# Patient Record
Sex: Male | Born: 1950 | Race: White | Hispanic: No | Smoking: Former smoker
Health system: Southern US, Community
[De-identification: ages and names within clinical notes are randomized; demographics above are authoritative.]

## PROBLEM LIST (undated history)

## (undated) DIAGNOSIS — I10 Essential (primary) hypertension: Secondary | ICD-10-CM

## (undated) DIAGNOSIS — E785 Hyperlipidemia, unspecified: Secondary | ICD-10-CM

## (undated) DIAGNOSIS — I2510 Atherosclerotic heart disease of native coronary artery without angina pectoris: Secondary | ICD-10-CM

## (undated) HISTORY — DX: Atherosclerotic heart disease of native coronary artery without angina pectoris: I25.10

## (undated) HISTORY — DX: Hyperlipidemia, unspecified: E78.5

## (undated) HISTORY — DX: Essential (primary) hypertension: I10

---

## 2005-06-06 ENCOUNTER — Emergency Department: Payer: Self-pay | Admitting: Emergency Medicine

## 2006-05-18 ENCOUNTER — Observation Stay: Payer: Self-pay | Admitting: Internal Medicine

## 2006-05-19 ENCOUNTER — Inpatient Hospital Stay (HOSPITAL_COMMUNITY): Admission: AD | Admit: 2006-05-19 | Discharge: 2006-05-21 | Payer: Self-pay | Admitting: Cardiology

## 2006-05-25 ENCOUNTER — Inpatient Hospital Stay (HOSPITAL_COMMUNITY): Admission: AD | Admit: 2006-05-25 | Discharge: 2006-05-27 | Payer: Self-pay | Admitting: Cardiology

## 2006-05-28 ENCOUNTER — Inpatient Hospital Stay (HOSPITAL_COMMUNITY): Admission: EM | Admit: 2006-05-28 | Discharge: 2006-05-30 | Payer: Self-pay | Admitting: Emergency Medicine

## 2006-10-01 ENCOUNTER — Emergency Department: Payer: Self-pay | Admitting: Emergency Medicine

## 2006-10-01 ENCOUNTER — Other Ambulatory Visit: Payer: Self-pay

## 2007-01-03 ENCOUNTER — Emergency Department: Payer: Self-pay | Admitting: General Practice

## 2007-08-14 IMAGING — CR DG CHEST 1V PORT
1 series · 1 of 1 positions shown · non-contrast
Comparison: none

REASON FOR EXAM: Chest tightness rm 4
COMMENTS:

[view not recorded]
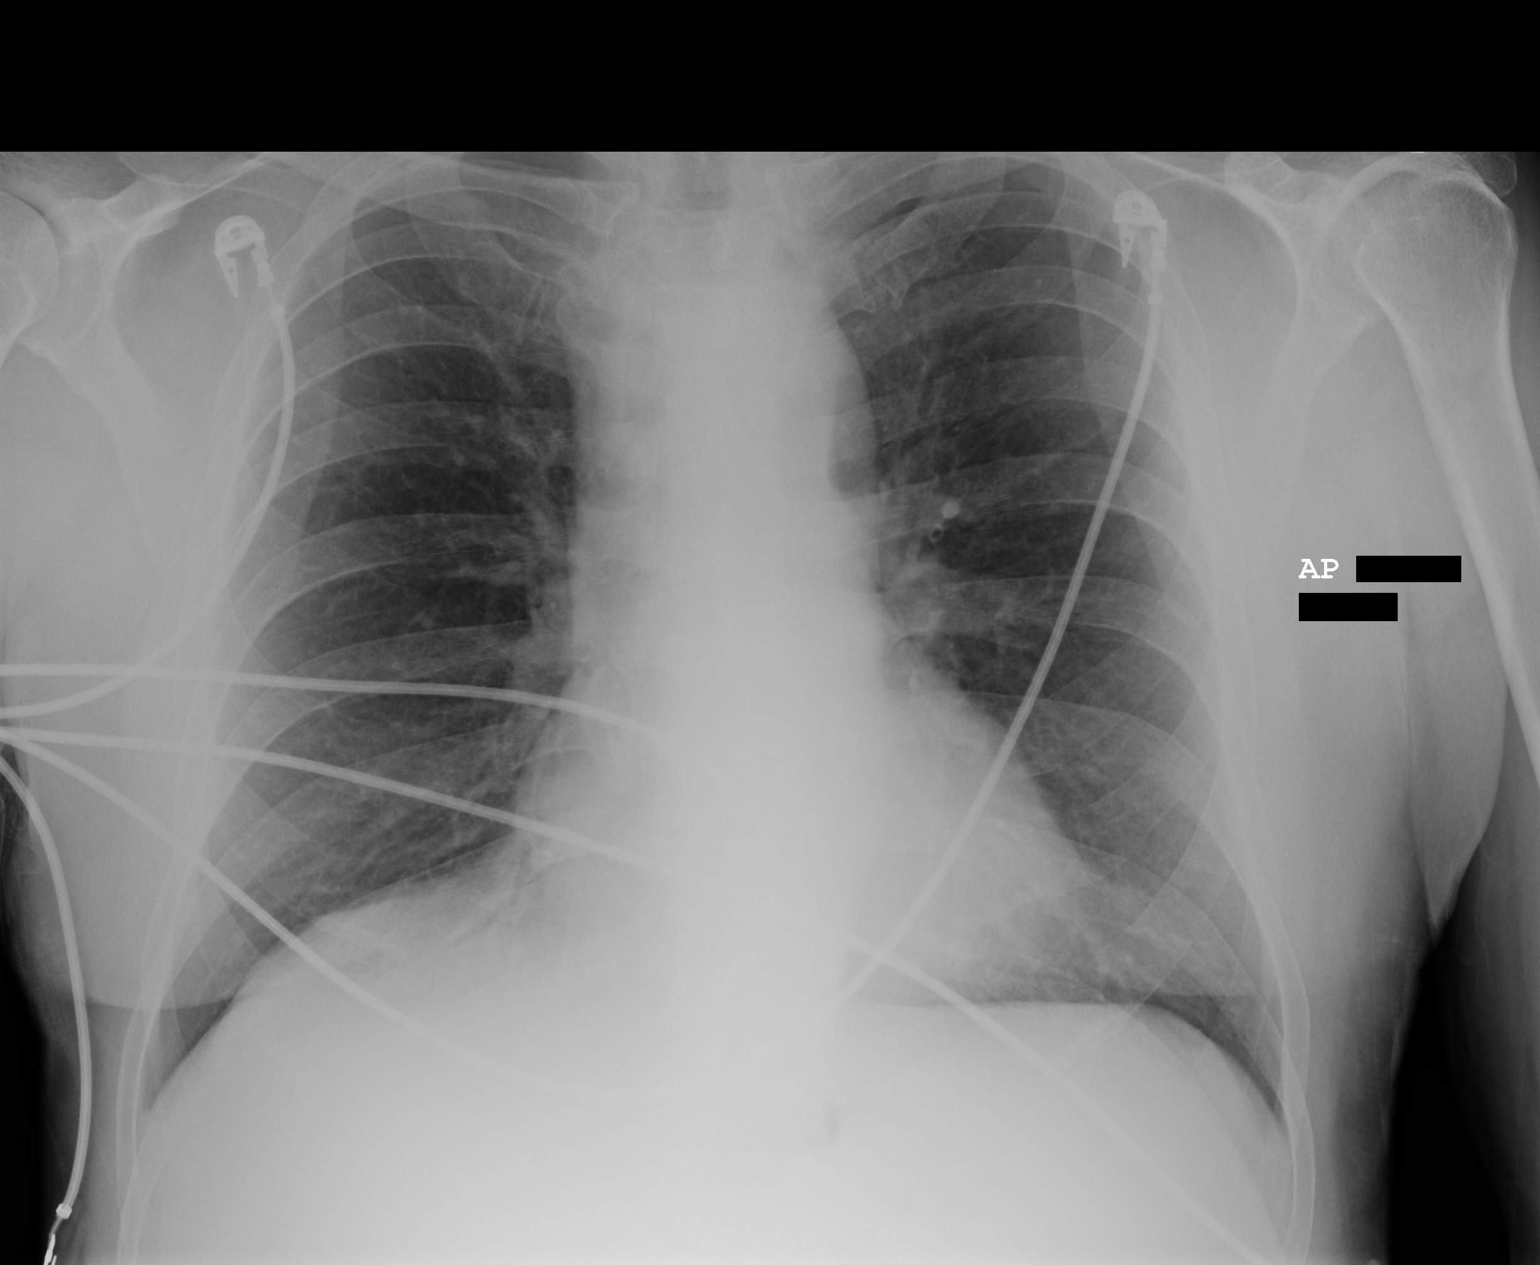

[1 of 1 positions shown; findings below may reference images not displayed]

PROCEDURE:     DXR - DXR PORTABLE CHEST SINGLE VIEW  - May 18, 2006  [DATE]

RESULT:     There is an element of flattening of the hemidiaphragms.  No
focal regions of consolidation are appreciated.  Cardiac silhouette is
within normal limits.  The visualized bony skeleton demonstrates no evidence
of fracture or dislocation.
IMPRESSION: Findings consistent with COPD without evidence of acute cardiopulmonary
disease.

## 2007-12-28 IMAGING — CR DG CHEST 2V
1 series · 2 of 2 positions shown · non-contrast
Comparison: none

REASON FOR EXAM: room--02  dizzness
COMMENTS:  LMP: (Male)

[Series 1: view not recorded · 0.17mm/px · 2 of 2 slices shown]
[im 1/2]
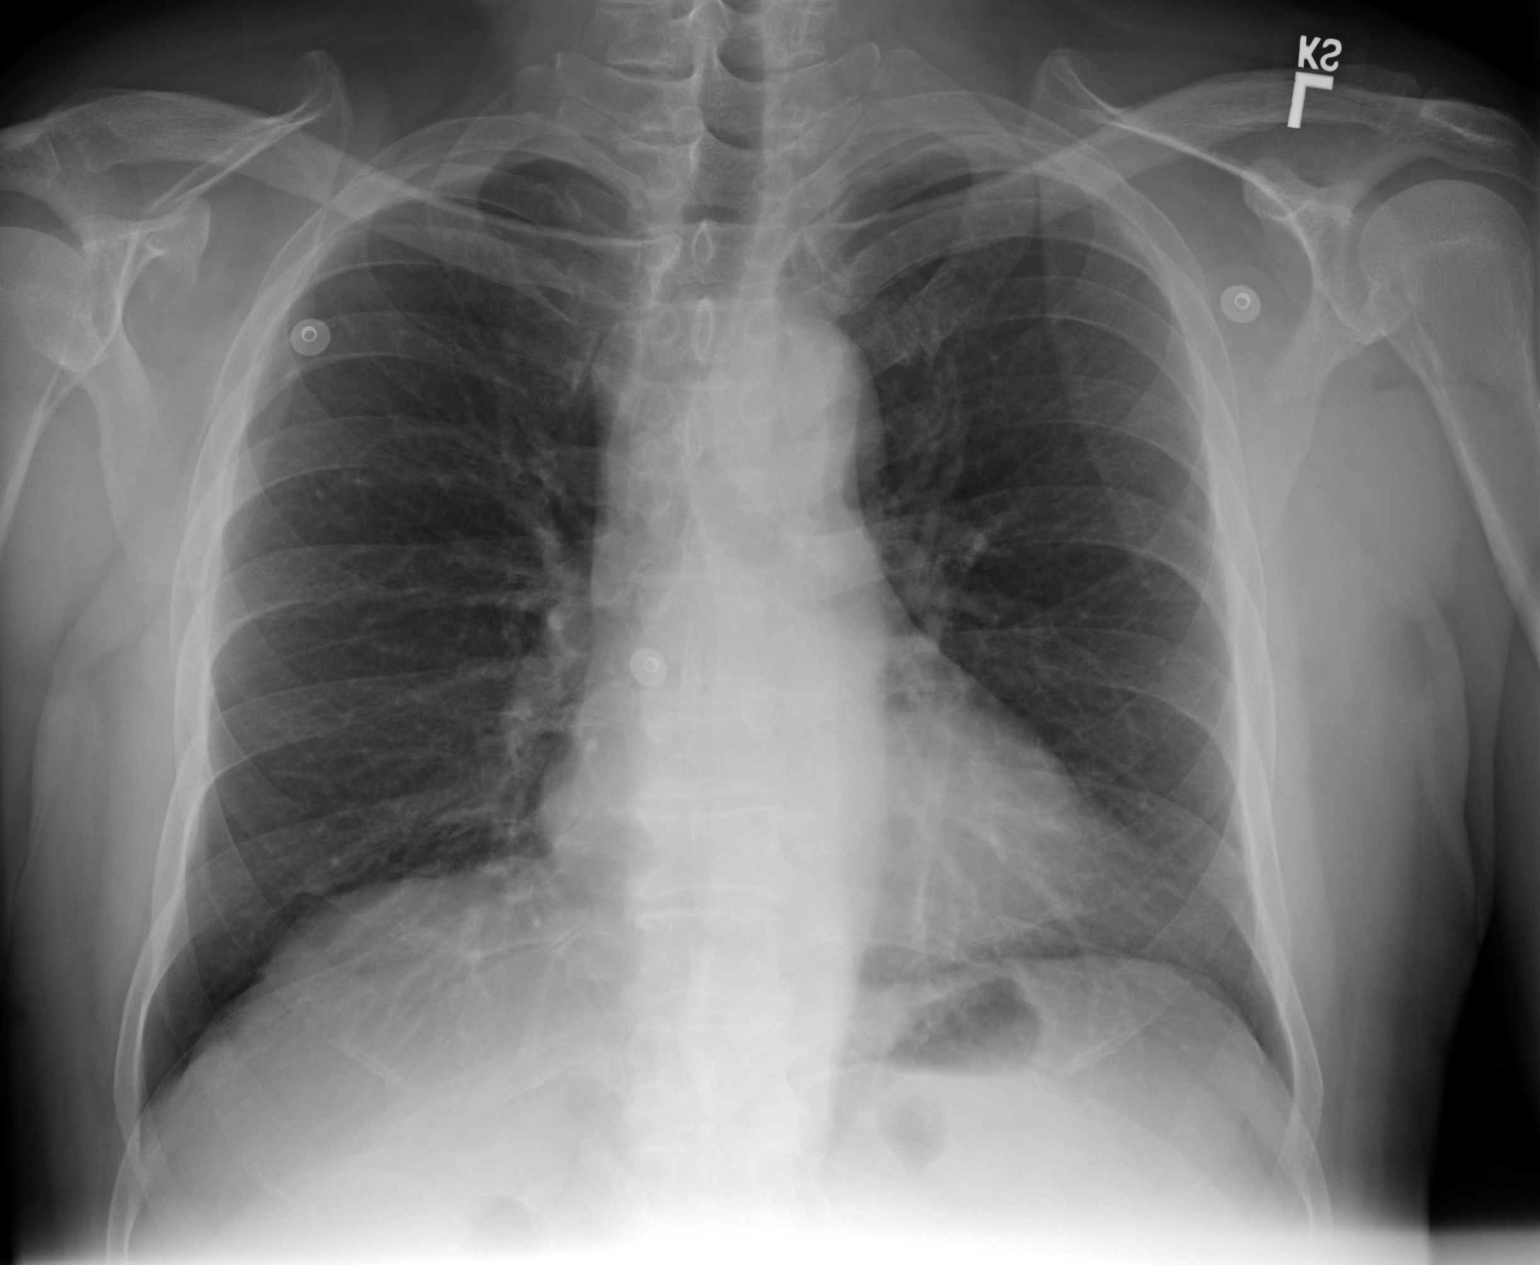
[im 2/2]
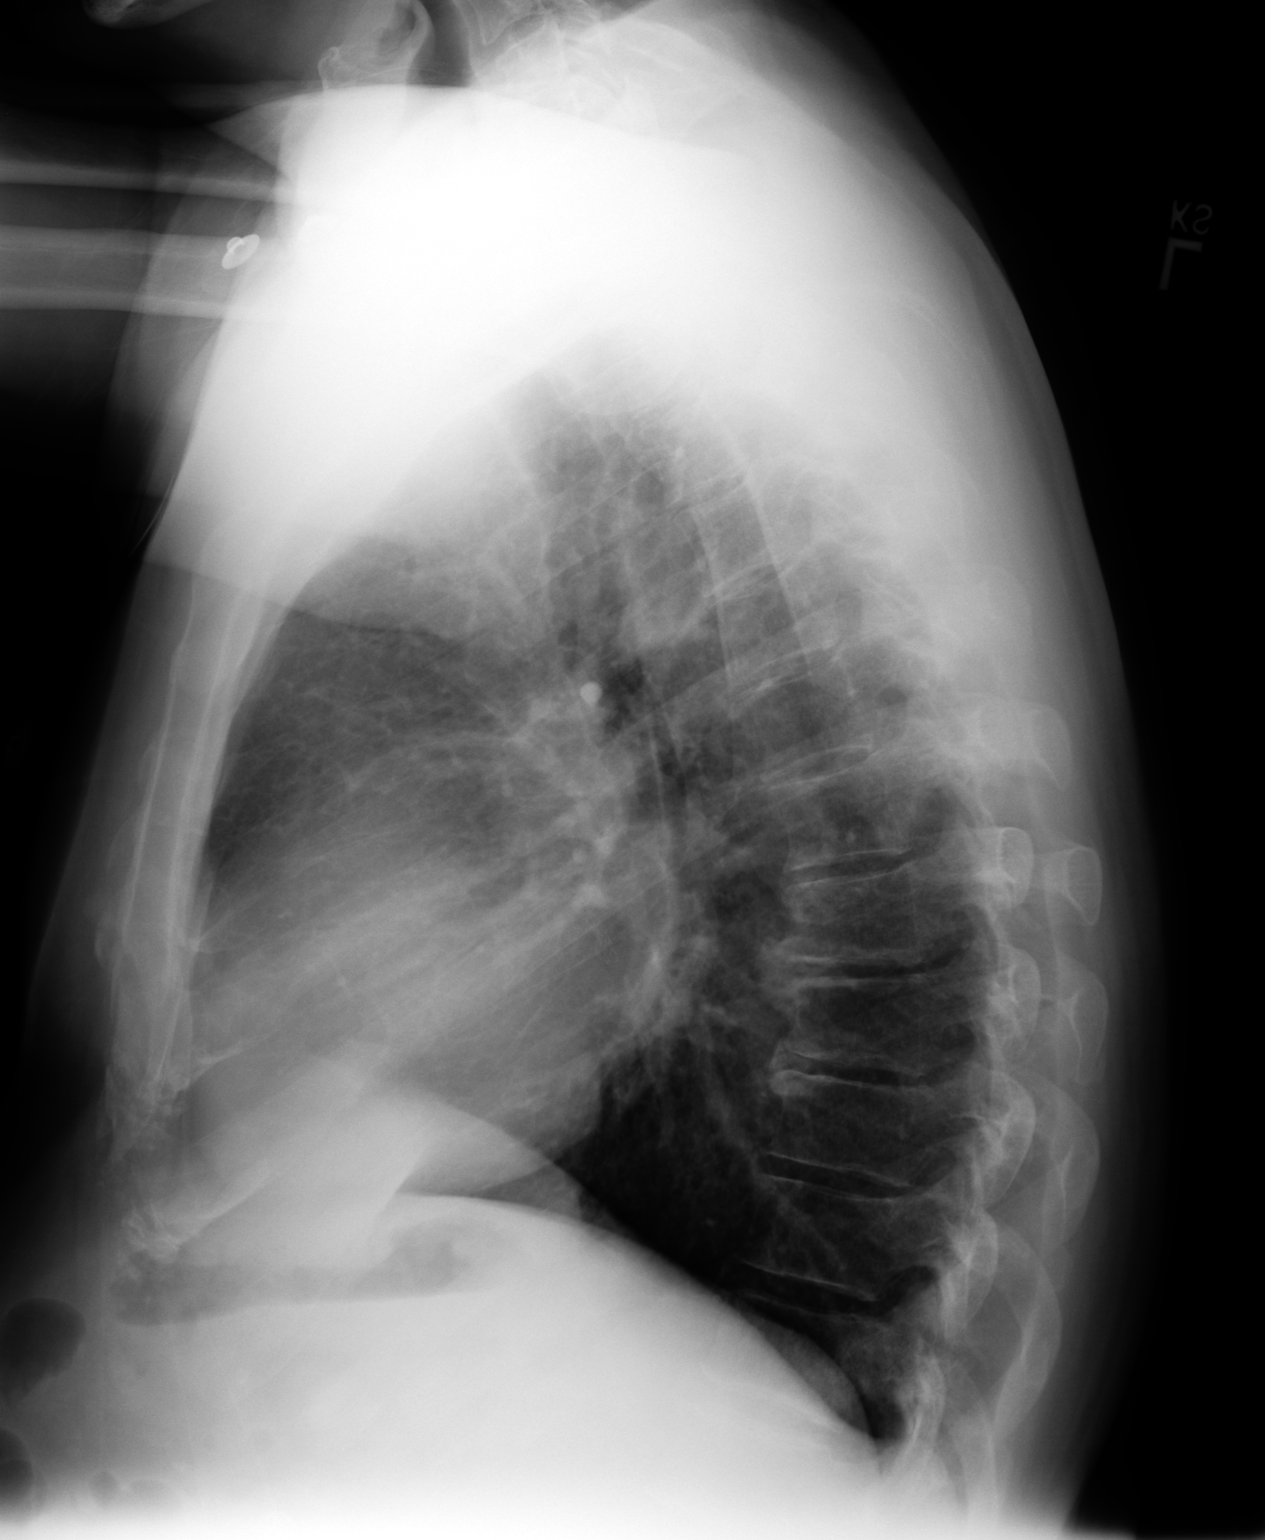

[2 of 2 positions shown; findings below may reference images not displayed]

PROCEDURE:     DXR - DXR CHEST PA (OR AP) AND LATERAL  - October 01, 2006  [DATE]

RESULT:     PA and lateral views were obtained and compared to prior AP
portable of 05/18/2006.  The cardiomediastinal structures are within normal
limits.  The lung fields are clear and the vascularity is within normal
limits.  NO effusions are seen.

Degenerative spurs are noted in the thoracic spine.
IMPRESSION: Lung fields are clear.  Basically unchanged from prior study.

## 2007-12-28 IMAGING — CT CT HEAD WITHOUT CONTRAST
2 series · 16 of 30 positions shown, 20 images · non-contrast
Comparison: none

REASON FOR EXAM: dizzness
COMMENTS:  LMP: (Male)

[Series 2: without · axial · non-contrast · 0.45mm/px · z∈[-127,+3]mm · 13 of 32 slices shown, 17 images]
[im 3/32  brain]
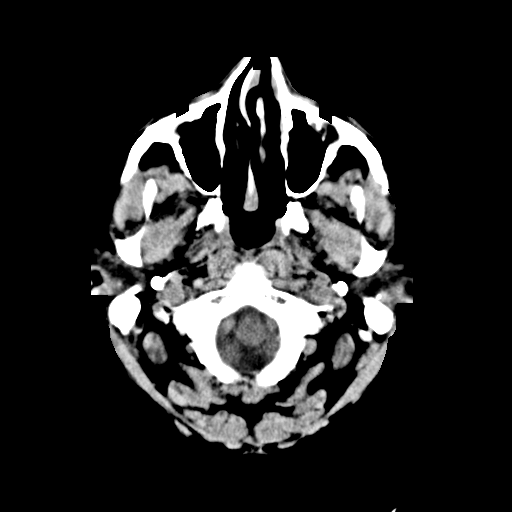
[im 3/32  bone]
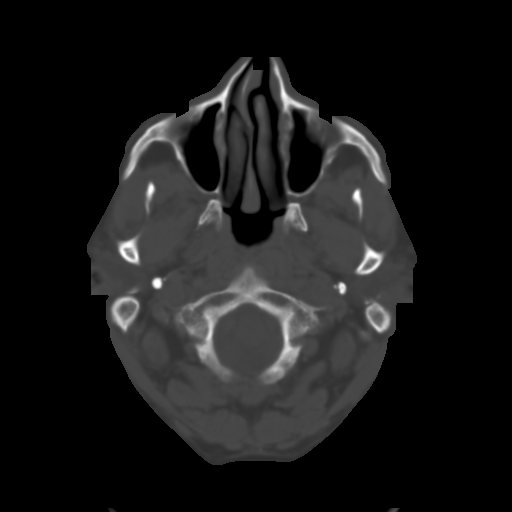
[im 5/32  brain]
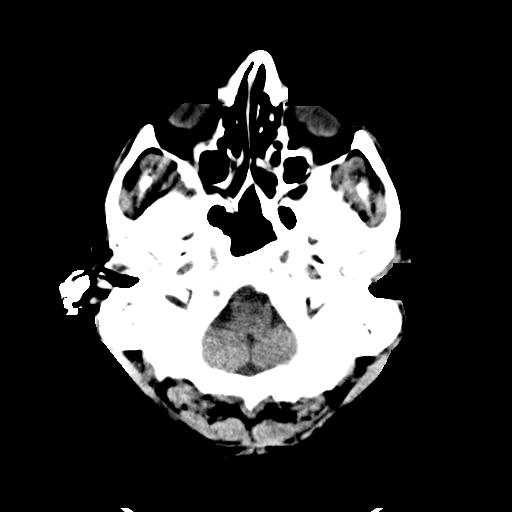
[im 7/32  brain]
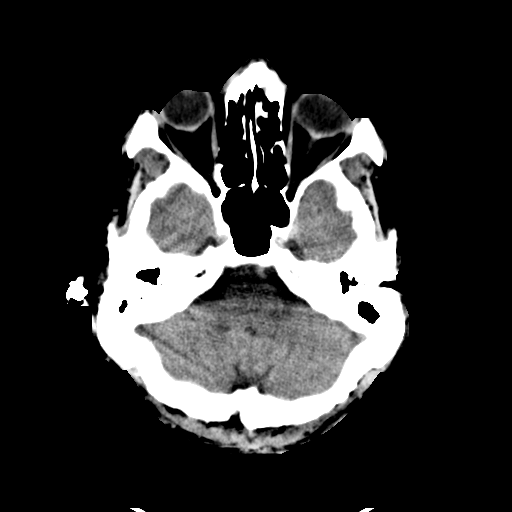
[im 9/32  brain]
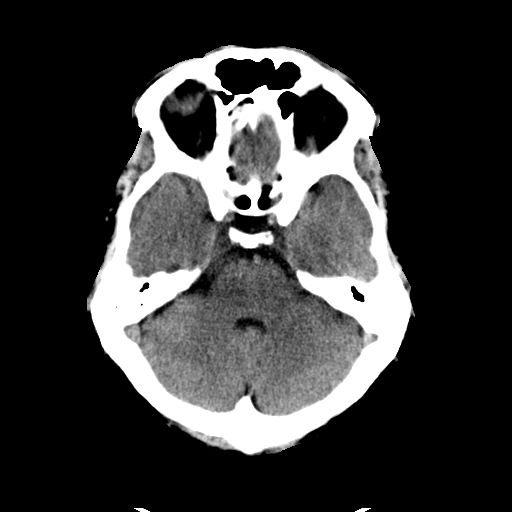
[im 12/32  brain]
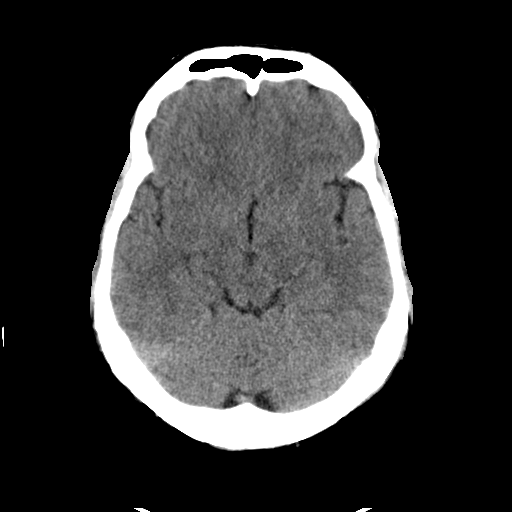
[im 12/32  bone]
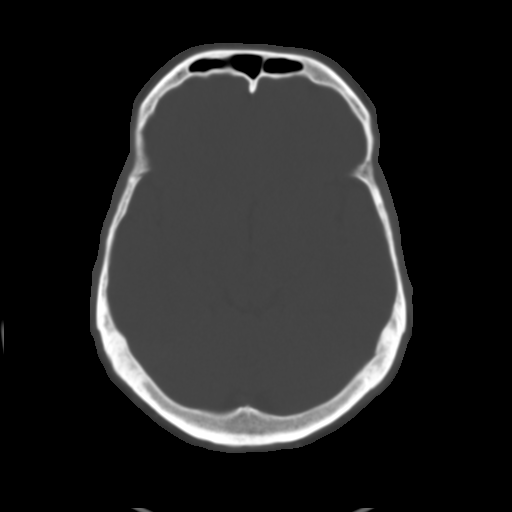
[im 14/32  brain]
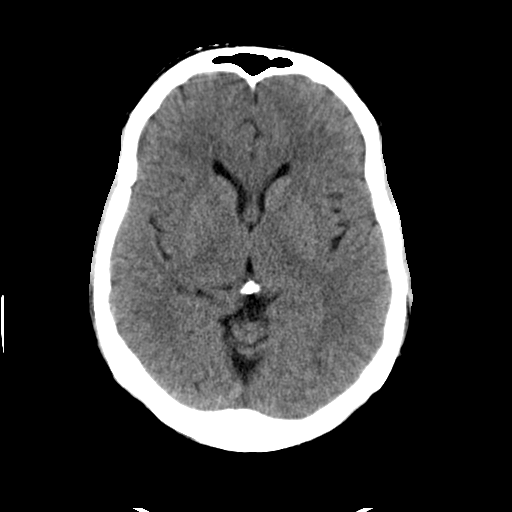
[im 16/32  brain]
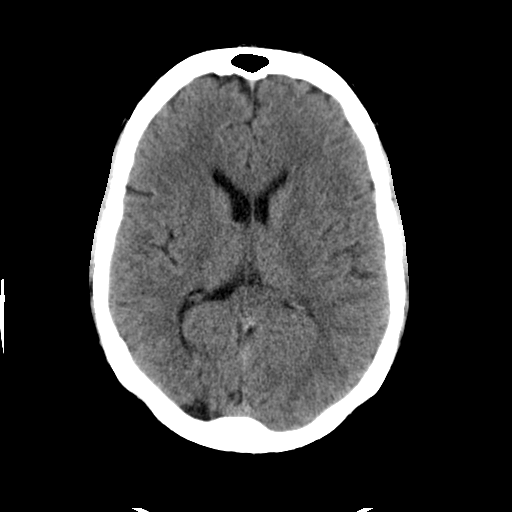
[im 18/32  brain]
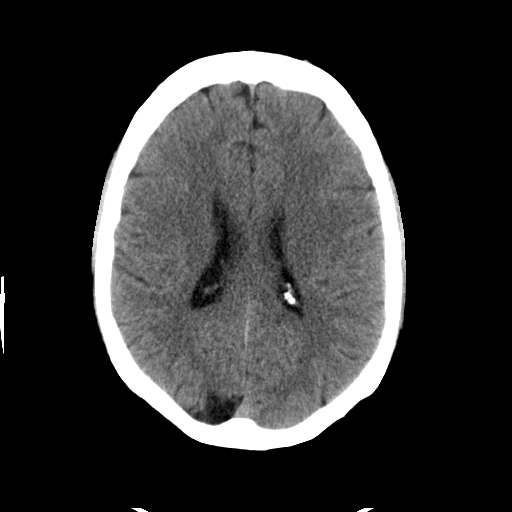
[im 20/32  brain]
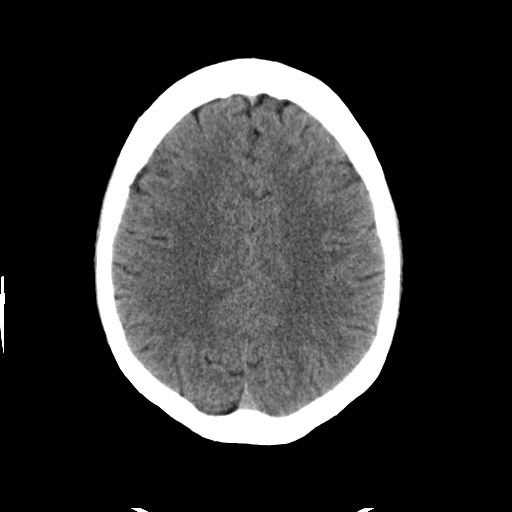
[im 20/32  bone]
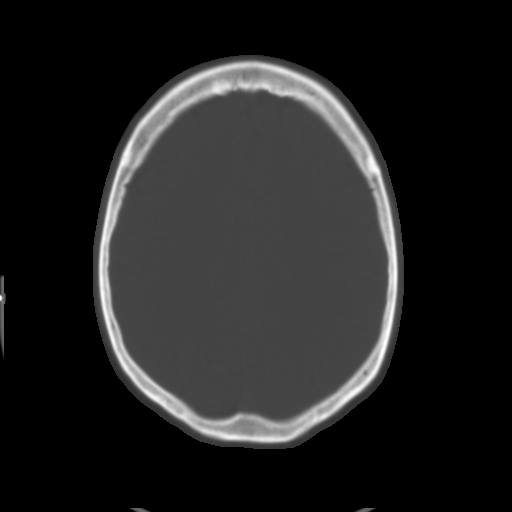
[im 23/32  brain]
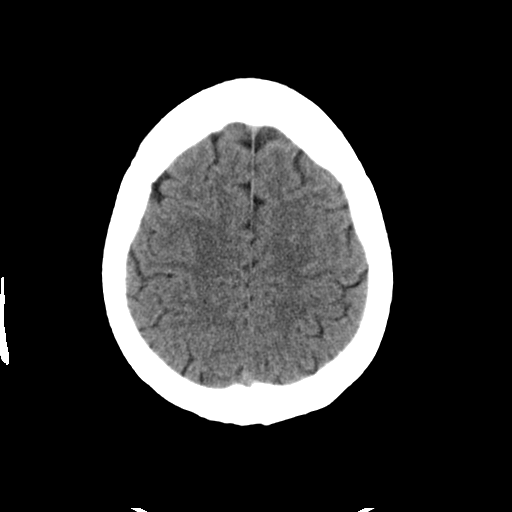
[im 25/32  brain]
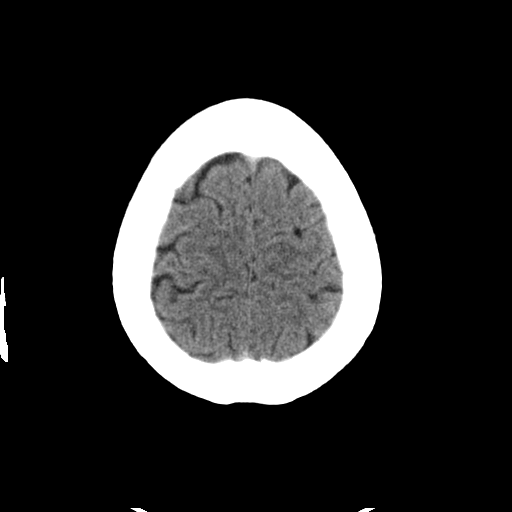
[im 27/32  brain]
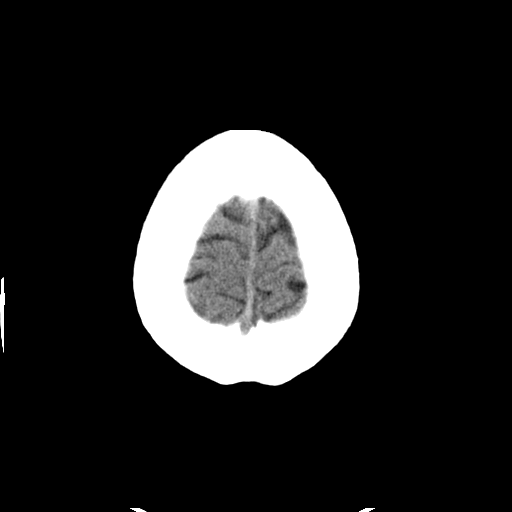
[im 29/32  brain]
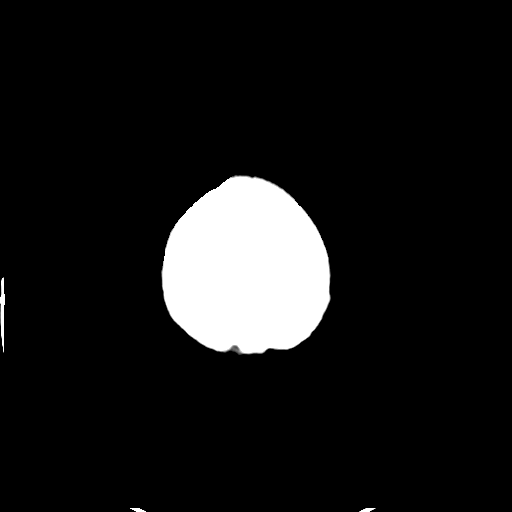
[im 29/32  bone]
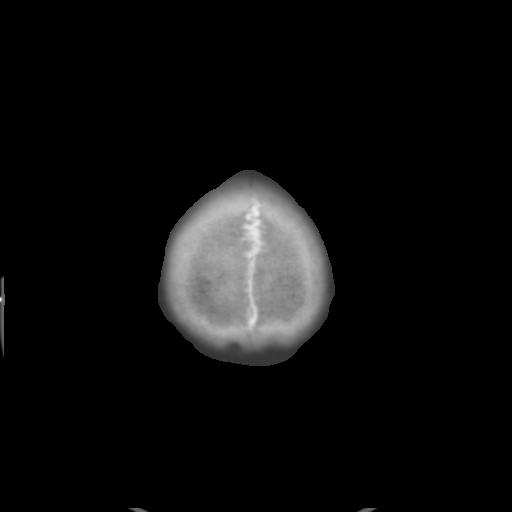

[Series 3: bone · axial · 0.45mm/px · z∈[-127,-82]mm · 3 of 32 slices shown]
[im 3/32  bone]
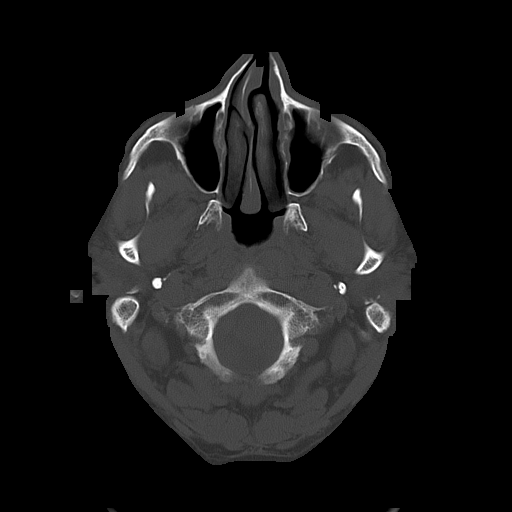
[im 7/32  bone]
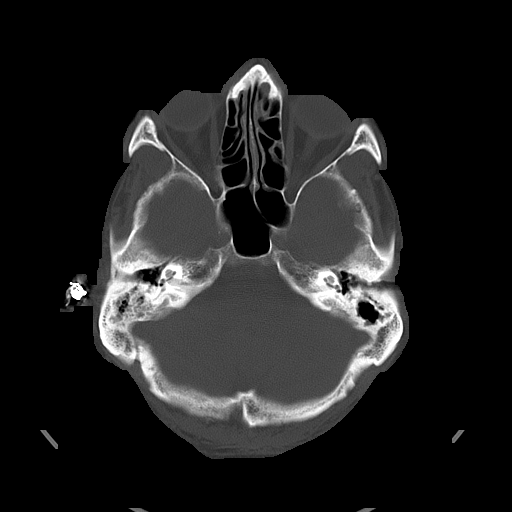
[im 12/32  bone]
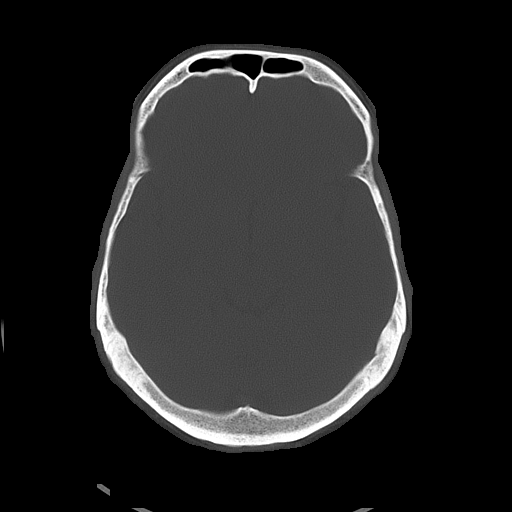

[16 of 30 positions shown; findings below may reference images not displayed]

PROCEDURE:     CT  - CT HEAD WITHOUT CONTRAST  - October 01, 2006  [DATE]

RESULT:     Unenhanced emergent head CT was performed for dizziness.  No
intracerebral bleeds.  No infarcts.  No mass effect.  No shift of midline.
No extraaxial fluid collections are identified.  Ventricles appear within
normal limits.

On the bone window settings no significant osseous abnormalities are
detected.
IMPRESSION: No acute abnormalities seen on the unenhanced head CT.  The report was
called to the emergency room at the conclusion of the dictation.

## 2018-03-15 ENCOUNTER — Ambulatory Visit: Payer: Self-pay | Admitting: Cardiovascular Disease

## 2023-03-18 ENCOUNTER — Encounter: Payer: Self-pay | Admitting: Cardiovascular Disease

## 2023-03-18 ENCOUNTER — Ambulatory Visit: Payer: Medicare HMO | Admitting: Cardiovascular Disease

## 2023-03-18 VITALS — BP 120/80 | HR 81 | Ht 67.0 in | Wt 170.0 lb

## 2023-03-18 DIAGNOSIS — E782 Mixed hyperlipidemia: Secondary | ICD-10-CM

## 2023-03-18 DIAGNOSIS — R0789 Other chest pain: Secondary | ICD-10-CM | POA: Diagnosis not present

## 2023-03-18 DIAGNOSIS — I2510 Atherosclerotic heart disease of native coronary artery without angina pectoris: Secondary | ICD-10-CM | POA: Diagnosis not present

## 2023-03-18 DIAGNOSIS — I10 Essential (primary) hypertension: Secondary | ICD-10-CM | POA: Diagnosis not present

## 2023-03-18 MED ORDER — NITROGLYCERIN 0.4 MG SL SUBL: 0.4000 mg | SUBLINGUAL_TABLET | SUBLINGUAL | 5 refills | Status: AC | PRN

## 2023-03-18 NOTE — Assessment & Plan Note (Signed)
Patient denies chest pain, shortness of breath. CCTA 12/2019 Stent in OM is patent. No significant disease in RCA or LAD. Normal stress test 11/2022. NTG refilled, current Rx expired. Continue same medications.

## 2023-03-18 NOTE — Progress Notes (Signed)
Cardiology Office Note   Date:  03/18/2023   ID:  Richard Porter, DOB Jun 06, 1951, MRN [id number]  PCP:  Laurier Nancy, MD  Cardiologist:  Adrian Blackwater, MD      History of Present Illness: Richard Porter is a 72 y.o. male who presents for  Chief Complaint  Patient presents with   Follow-up    4 Months Follow Up    Patient in office for routine cardiac exam. Denies chest pain, shortness of breath, edema, palpitations.     Past Medical History:  Diagnosis Date   CAD (coronary artery disease)    Hyperlipidemia    Hypertension      History reviewed. No pertinent surgical history.   Current Outpatient Medications  Medication Sig Dispense Refill   aspirin EC 81 MG tablet Take 81 mg by mouth once.     clopidogrel (PLAVIX) 75 MG tablet Take 1 tablet by mouth daily.     isosorbide mononitrate (IMDUR) 120 MG 24 hr tablet Take 120 mg by mouth daily.     lisinopril (ZESTRIL) 20 MG tablet Take 20 mg by mouth daily.     metoprolol succinate (TOPROL-XL) 25 MG 24 hr tablet Take 12.5 mg by mouth daily.     pantoprazole (PROTONIX) 20 MG tablet Take 20 mg by mouth daily.     Ped Multivitamins-Fl-Iron (MULTIVITAMIN/FLUORIDE/IRON PO) Take by mouth.     rosuvastatin (CRESTOR) 20 MG tablet Take 20 mg by mouth daily.     Turmeric 500 MG CAPS Take by mouth.     nitroGLYCERIN (NITROSTAT) 0.4 MG SL tablet Place 1 tablet (0.4 mg total) under the tongue every 5 (five) minutes as needed for chest pain. 20 tablet 5   No current facility-administered medications for this visit.    Allergies:   Patient has no allergy information on record.    Social History:   reports that he has quit smoking. His smoking use included cigarettes. He has never used smokeless tobacco. No history on file for alcohol use and drug use.   Family History:  family history is not on file.    ROS:     Review of Systems  Constitutional: Negative.   HENT: Negative.    Eyes: Negative.   Respiratory: Negative.     Cardiovascular: Negative.   Gastrointestinal: Negative.   Genitourinary: Negative.   Musculoskeletal: Negative.   Skin: Negative.   Neurological: Negative.   Endo/Heme/Allergies: Negative.   Psychiatric/Behavioral: Negative.    All other systems reviewed and are negative.    All other systems are reviewed and negative.    PHYSICAL EXAM: VS:  BP 120/80   Pulse 81   Ht 5\' 7"  (1.702 m)   Wt 170 lb (77.1 kg)   SpO2 96%   BMI 26.63 kg/m  , BMI Body mass index is 26.63 kg/m. Last weight:  Wt Readings from Last 3 Encounters:  03/18/23 170 lb (77.1 kg)     Physical Exam Vitals reviewed.  Constitutional:      Appearance: Normal appearance. He is normal weight.  HENT:     Head: Normocephalic.     Nose: Nose normal.     Mouth/Throat:     Mouth: Mucous membranes are moist.  Eyes:     Pupils: Pupils are equal, round, and reactive to light.  Cardiovascular:     Rate and Rhythm: Normal rate and regular rhythm.     Pulses: Normal pulses.     Heart sounds: Normal heart sounds.  Pulmonary:     Effort: Pulmonary effort is normal.  Abdominal:     General: Abdomen is flat. Bowel sounds are normal.  Musculoskeletal:        General: Normal range of motion.     Cervical back: Normal range of motion.  Skin:    General: Skin is warm.  Neurological:     General: No focal deficit present.     Mental Status: He is alert.  Psychiatric:        Mood and Affect: Mood normal.     EKG: none today  Recent Labs: No results found for requested labs within last 365 days.    Lipid Panel No results found for: "CHOL", "TRIG", "HDL", "CHOLHDL", "VLDL", "LDLCALC", "LDLDIRECT"    Other studies Reviewed: Patient: 8163.0 - Pola Corn DOB:  1951-08-10  Date:  10/29/2022 11:27 Provider: Adrian Blackwater MD Encounter: [facility]                                                                                        [facility] [address] [place], [place] [address] [phone] STUDY:  Gated Stress / Rest Myocardial Perfusion Imaging Tomographic (SPECT) Including attenuation correction Wall Motion, Left Ventricular Ejection Fraction By Gated Technique.Treadmill Stress Test. SEX: Male  WEIGHT: 176 lbs   HEIGHT: 70 in    ARMS UP: YES/NO                                                                        REFERRING PHYSICIAN: Dr.Ange Puskas [place] Flakes                                                                                                                                                                                                                       INDICATION FOR STUDY:  SOB  TECHNIQUE:  Approximately 20 minutes following the intravenous administration of 10.6 mCi of Tc-59m Sestamibi after stress testing in a reclined supine position with arms above their head if able to do so, gated SPECT imaging of the heart was performed. After about a 2hr break, the patient was injected intravenously with 31.9 mCi of Tc-89m Sestamibi.  Approximately 45 minutes later in the same position as stress imaging SPECT rest imaging of the heart was performed.  STRESS BY:  Adrian Blackwater, MD PROTOCOL: Smitty Cords                                                                                        MAX PRED HR: 148                     85%: 126               75%:  111                                                                                                                  RESTING BP: 140/78  RESTING HR: 64  PEAK BP: 152/88   PEAK HR: 111 (75%)                                                                     EXERCISE DURATION: 6:00                                             METS: 7.0     REASON FOR TEST TERMINATION: Fatigue  SYMPTOMS: Fatigue [facility] TREADMILL SCORE: 6                                       RISK: Low                                                                                                                                                                                                           EKG RESULTS: NSR. 63/min. No significant ST changes at peak exercise.                                                              IMAGE QUALITY: Good  PERFUSION/WALL MOTION FINDINGS: EF = 67%. No perfusion defects, normal wall motion.                                                                           IMPRESSION: Normal stress test with normal LVEF.                                                                                                                                                                                                                                                                                         Adrian Blackwater, MD Stress Interpreting Physician / Nuclear Interpreting Physician  Adrian Blackwater MD  Electronically signed by: Adrian Blackwater     Date: 11/02/2022 12:34  Patient: 8163.0 - Pola Corn DOB:  [birth year]  Date:  11/05/2022 11:30 Provider: Adrian Blackwater MD Encounter: ECHO   Page 2 REASON FOR VISIT  Visit for: Echocardiogram/R06.02  Sex:   Male         wt= 176   lbs.  BP=122/80  Height= 70   inches.    TESTS  Imaging: Echocardiogram:  An echocardiogram in (2-d) mode was performed and in Doppler mode with color flow velocity mapping was performed. The  aortic valve cusps are abnormal 2.3  cm, flow velocity .979  m/s, and systolic calculated mean flow gradient 2 mmHg. Mitral valve diastolic peak flow velocity E .711   m/s and E/A ratio 0.7. Aortic root diameter 3.7  cm. The LVOT internal diameter 3.4  cm and flow velocity was abnormal 1.02   m/s. LV systolic dimension 2.33  cm, diastolic 3.45  cm, posterior wall thickness 1.73   cm, fractional shortening 32.5  %, and EF 61.9 %. IVS thickness 1.65  cm. LA dimension 3.9 cm. Mitral Valve has Trace Regurgitation. Tricuspid Valve has Trace Regurgitation.     ASSESSMENT  Technically difficult study due to  body habitus.  Normal chamber sizes.  Normal left ventricular systolic function.  Mild left ventricular hypertrophy with GRADE 1 (relaxation abnormality) diastolic dysfunction.  Normal right ventricular systolic function.  Normal right ventricular diastolic function.  Normal left ventricular wall motion.  Normal right ventricular wall motion.  Trace tricuspid regurgitation.  Normal pulmonary artery pressure.  Trace mitral regurgitation.  No pericardial effusion.  Mildly dilated Left atrium.  Mild LVH.  Sonographer: Adrian Blackwater.   Adrian Blackwater MD  Electronically signed by: Adrian Blackwater     Date: 11/05/2022 13:00  Patient: 8163.0 - Pola Corn DOB:  1951-03-13  Date:  01/28/2020 09:30 Provider: Adrian Blackwater MD Encounter: ALL ANGIOGRAMS (CTA BRAIN, CAROTIDS, RENAL ARTERIES, PE)  TESTS  Imaging: Computed Tomographic Angiography:  Cardiac multidetector CT was performed paying particular attention to the coronary arteries for the diagnosis of: CAD. Diagnostic Drugs:  Administered iohexol (Omnipaque) through an antecubital vein and images from the examination were analyzed for the presence and extent of coronary artery disease, using 3D image processing software. 100 mL of non-ionic contrast (Omnipaque) was used.   TEST CONCLUSIONS  Quality of study: Good  1-Calcium score:  446.5  2-Right dominant system.  3-Stent in OM is patent. No significant disease in RCA or LAD.  Adrian Blackwater MD  Electronically signed by: Adrian Blackwater     Date: 01/29/2020 09:58   ASSESSMENT AND PLAN:    ICD-10-CM   1. Other chest pain  R07.89 nitroGLYCERIN (NITROSTAT) 0.4 MG SL tablet    2. Coronary artery disease involving native coronary artery of native heart without angina pectoris  I25.10     3. Mixed hyperlipidemia  E78.2     4. Primary hypertension  I10        Problem List Items Addressed This Visit       Cardiovascular and Mediastinum   Coronary artery disease involving native coronary artery of native heart without angina pectoris    Patient denies chest pain, shortness of breath. CCTA 12/2019 Stent in OM is patent. No significant disease in RCA or LAD. Normal stress test 11/2022. NTG refilled, current Rx expired. Continue same medications.       Relevant Medications   aspirin EC 81 MG tablet   metoprolol succinate (TOPROL-XL) 25 MG 24 hr tablet   lisinopril (ZESTRIL) 20 MG tablet   isosorbide mononitrate (IMDUR) 120 MG 24 hr tablet   rosuvastatin (CRESTOR) 20 MG tablet   nitroGLYCERIN (NITROSTAT) 0.4 MG SL tablet   Primary hypertension    Well controlled today. Echo 11/2022 EF 61%, grade I DD. Continue same medications.       Relevant Medications   aspirin EC 81 MG tablet   metoprolol succinate (TOPROL-XL) 25 MG 24 hr tablet   lisinopril (ZESTRIL) 20 MG tablet   isosorbide mononitrate (IMDUR) 120 MG 24 hr tablet   rosuvastatin (CRESTOR) 20 MG tablet   nitroGLYCERIN (NITROSTAT) 0.4 MG SL tablet     Other   Mixed hyperlipidemia    01/2023 LDL 39. Continue rosuvastatin 20 mg daily.      Relevant Medications   aspirin EC 81 MG tablet   metoprolol succinate (TOPROL-XL) 25 MG 24 hr tablet   lisinopril (ZESTRIL) 20 MG tablet   isosorbide mononitrate (IMDUR) 120 MG 24 hr tablet   rosuvastatin (CRESTOR) 20 MG tablet   nitroGLYCERIN (NITROSTAT) 0.4 MG SL  tablet   Other Visit Diagnoses     Other chest pain    -  Primary   Relevant  Medications   nitroGLYCERIN (NITROSTAT) 0.4 MG SL tablet       Disposition:   Return in about 4 months (around 07/19/2023).    Total time spent: 30 minutes  Signed,  Adrian Blackwater, MD  03/18/2023 9:32 AM    [facility]

## 2023-03-18 NOTE — Assessment & Plan Note (Signed)
Well controlled today. Echo 11/2022 EF 61%, grade I DD. Continue same medications.

## 2023-03-18 NOTE — Assessment & Plan Note (Signed)
01/2023 LDL 39. Continue rosuvastatin 20 mg daily.

## 2023-04-16 ENCOUNTER — Other Ambulatory Visit: Payer: Self-pay | Admitting: Cardiovascular Disease

## 2023-04-24 ENCOUNTER — Other Ambulatory Visit: Payer: Self-pay | Admitting: Cardiovascular Disease

## 2023-07-22 ENCOUNTER — Ambulatory Visit: Payer: Medicare HMO | Admitting: Cardiovascular Disease

## 2023-07-22 ENCOUNTER — Encounter: Payer: Self-pay | Admitting: Cardiovascular Disease

## 2023-07-22 VITALS — BP 131/73 | HR 60 | Ht 68.0 in | Wt 172.0 lb

## 2023-07-22 DIAGNOSIS — E782 Mixed hyperlipidemia: Secondary | ICD-10-CM

## 2023-07-22 DIAGNOSIS — I2510 Atherosclerotic heart disease of native coronary artery without angina pectoris: Secondary | ICD-10-CM | POA: Diagnosis not present

## 2023-07-22 DIAGNOSIS — R0789 Other chest pain: Secondary | ICD-10-CM

## 2023-07-22 DIAGNOSIS — I10 Essential (primary) hypertension: Secondary | ICD-10-CM | POA: Diagnosis not present

## 2023-07-22 NOTE — Progress Notes (Signed)
/     Cardiology Office Note   Date:  07/22/2023   ID:  [name], DOB [birth year], MRN [id number]  PCP:  Laurier Nancy, MD  Cardiologist:  Adrian Blackwater, MD      History of Present Illness: Richard Porter is a 72 y.o. male who presents for  Chief Complaint  Patient presents with   Follow-up    Doing ok      Past Medical History:  Diagnosis Date   CAD (coronary artery disease)    Hyperlipidemia    Hypertension      History reviewed. No pertinent surgical history.   Current Outpatient Medications  Medication Sig Dispense Refill   aspirin EC 81 MG tablet Take 81 mg by mouth once.     clopidogrel (PLAVIX) 75 MG tablet Take 1 tablet by mouth daily.     isosorbide mononitrate (IMDUR) 120 MG 24 hr tablet TAKE 1 TABLET EVERY DAY 90 tablet 3   lisinopril (ZESTRIL) 20 MG tablet Take 20 mg by mouth daily.     metoprolol succinate (TOPROL-XL) 25 MG 24 hr tablet Take 12.5 mg by mouth daily.     nitroGLYCERIN (NITROSTAT) 0.4 MG SL tablet Place 1 tablet (0.4 mg total) under the tongue every 5 (five) minutes as needed for chest pain. 20 tablet 5   pantoprazole (PROTONIX) 20 MG tablet TAKE 1 TABLET DAILY FOR ACID REFLUX 90 tablet 3   Ped Multivitamins-Fl-Iron (MULTIVITAMIN/FLUORIDE/IRON PO) Take by mouth.     rosuvastatin (CRESTOR) 20 MG tablet TAKE 1 TABLET EVERY DAY 90 tablet 3   Turmeric 500 MG CAPS Take by mouth.     No current facility-administered medications for this visit.    Allergies:   Patient has no allergy information on record.    Social History:   reports that he has quit smoking. His smoking use included cigarettes. He has never used smokeless tobacco. No history on file for alcohol use and drug use.   Family History:  family history is not on file.    ROS:     Review of Systems  Constitutional: Negative.   HENT: Negative.    Eyes: Negative.   Respiratory: Negative.    Gastrointestinal: Negative.   Genitourinary: Negative.   Musculoskeletal:  Negative.   Skin: Negative.   Neurological: Negative.   Endo/Heme/Allergies: Negative.   Psychiatric/Behavioral: Negative.    All other systems reviewed and are negative.     All other systems are reviewed and negative.    PHYSICAL EXAM: VS:  BP 131/73   Pulse 60   Ht 5\' 8"  (1.727 m)   Wt 172 lb (78 kg)   SpO2 97%   BMI 26.15 kg/m  , BMI Body mass index is 26.15 kg/m. Last weight:  Wt Readings from Last 3 Encounters:  07/22/23 172 lb (78 kg)  03/18/23 170 lb (77.1 kg)     Physical Exam Vitals reviewed.  Constitutional:      Appearance: Normal appearance. He is normal weight.  HENT:     Head: Normocephalic.     Nose: Nose normal.     Mouth/Throat:     Mouth: Mucous membranes are moist.  Eyes:     Pupils: Pupils are equal, round, and reactive to light.  Cardiovascular:     Rate and Rhythm: Normal rate and regular rhythm.     Pulses: Normal pulses.     Heart sounds: Normal heart sounds.  Pulmonary:     Effort: Pulmonary effort is normal.  Abdominal:     General: Abdomen is flat. Bowel sounds are normal.  Musculoskeletal:        General: Normal range of motion.     Cervical back: Normal range of motion.  Skin:    General: Skin is warm.  Neurological:     General: No focal deficit present.     Mental Status: He is alert.  Psychiatric:        Mood and Affect: Mood normal.       EKG:   Recent Labs: No results found for requested labs within last 365 days.    Lipid Panel No results found for: "CHOL", "TRIG", "HDL", "CHOLHDL", "VLDL", "LDLCALC", "LDLDIRECT"    Other studies Reviewed: Additional studies/ records that were reviewed today include:  Review of the above records demonstrates:       No data to display            ASSESSMENT AND PLAN:    ICD-10-CM   1. Other chest pain  R07.89     2. Coronary artery disease involving native coronary artery of native heart without angina pectoris  I25.10    sstress test lst year normal, no chest  pain    3. Mixed hyperlipidemia  E78.2     4. Primary hypertension  I10        Problem List Items Addressed This Visit       Cardiovascular and Mediastinum   Coronary artery disease involving native coronary artery of native heart without angina pectoris   Primary hypertension     Other   Mixed hyperlipidemia   Other Visit Diagnoses     Other chest pain    -  Primary          Disposition:   Return in about 3 months (around 10/21/2023).    Total time spent: 30 minutes  Signed,  Adrian Blackwater, MD  07/22/2023 9:17 AM    [facility]

## 2023-08-31 ENCOUNTER — Other Ambulatory Visit: Payer: Self-pay | Admitting: Cardiology

## 2023-10-21 ENCOUNTER — Ambulatory Visit: Payer: Medicare HMO | Admitting: Cardiovascular Disease

## 2023-12-19 DIAGNOSIS — R0602 Shortness of breath: Secondary | ICD-10-CM | POA: Diagnosis not present

## 2023-12-31 DEATH — deceased
